# Patient Record
Sex: Male | Born: 1960 | Hispanic: No | Smoking: Former smoker
Health system: Southern US, Community
[De-identification: ages and names within clinical notes are randomized; demographics above are authoritative.]

## PROBLEM LIST (undated history)

## (undated) DIAGNOSIS — L309 Dermatitis, unspecified: Secondary | ICD-10-CM

## (undated) DIAGNOSIS — M25552 Pain in left hip: Secondary | ICD-10-CM

## (undated) DIAGNOSIS — G4730 Sleep apnea, unspecified: Secondary | ICD-10-CM

## (undated) DIAGNOSIS — G4733 Obstructive sleep apnea (adult) (pediatric): Secondary | ICD-10-CM

## (undated) DIAGNOSIS — Z9989 Dependence on other enabling machines and devices: Secondary | ICD-10-CM

## (undated) HISTORY — PX: NOSE SURGERY: SHX723

## (undated) HISTORY — DX: Dependence on other enabling machines and devices: Z99.89

## (undated) HISTORY — DX: Obstructive sleep apnea (adult) (pediatric): G47.33

## (undated) HISTORY — DX: Dermatitis, unspecified: L30.9

## (undated) HISTORY — DX: Pain in left hip: M25.552

---

## 2005-05-17 DIAGNOSIS — G4733 Obstructive sleep apnea (adult) (pediatric): Secondary | ICD-10-CM

## 2005-05-17 HISTORY — DX: Obstructive sleep apnea (adult) (pediatric): G47.33

## 2005-08-18 ENCOUNTER — Ambulatory Visit (HOSPITAL_BASED_OUTPATIENT_CLINIC_OR_DEPARTMENT_OTHER): Admission: RE | Admit: 2005-08-18 | Discharge: 2005-08-18 | Payer: Self-pay | Admitting: Otolaryngology

## 2005-08-22 ENCOUNTER — Ambulatory Visit: Payer: Self-pay | Admitting: Internal Medicine

## 2007-11-23 ENCOUNTER — Ambulatory Visit (HOSPITAL_COMMUNITY): Admission: RE | Admit: 2007-11-23 | Discharge: 2007-11-24 | Payer: Self-pay | Admitting: Otolaryngology

## 2010-09-29 NOTE — Op Note (Signed)
NAMEKEI, MCELHINEY NO.:  [id number]   MEDICAL RECORD NO.:  [id number]          PATIENT TYPE:  OIB   LOCATION:  3313                         FACILITY:  [facility]   PHYSICIAN:  Suzanna Obey, M.D.       DATE OF BIRTH:  June 06, 1960   DATE OF PROCEDURE:  11/23/2007  DATE OF DISCHARGE:                               OPERATIVE REPORT   PREOPERATIVE DIAGNOSES:  1. Deviated septum.  2. Turbinate hypertrophy.   POSTOPERATIVE DIAGNOSES:  1. Deviated septum.  2. Turbinate hypertrophy.   SURGICAL PROCEDURE:  Septoplasty and submucous resection of the inferior  turbinates.   ANESTHESIA:  General.   ESTIMATED BLOOD LOSS:  Less than 10 mL.   INDICATIONS:  This is a 50 year old with severe obstructive sleep apnea  that has been refracted to medical therapy and he requires a CPAP.  He  has nasal obstruction, so needs to have a good functioning nose.  He was  informed the risks and benefits of the procedure and options were  discussed.  All questions were answered and consent was obtained.   OPERATION:  The patient was taken to the operating room and placed in  supine position.  After general endotracheal tube anesthesia, he was  prepped and draped in the usual sterile manner.  Oxymetazoline pledgets  and 1% lidocaine with 1:100,000 epinephrine were placed in the nose, in  the septum, and inferior turbinates.  The right hemitransfixion incision  was performed raising mucoperichondrial and osteal flap.  The caudal was  divided about 2 cm posterior to the caudal strut and the deviated  portion of the cartilages were removed.  The Jansen-Middleton forceps  were used to remove the deviated bone and a 4-mm osteotome to remove the  spur on the left side.  This corrected the septal deflection.  The  turbinates were infractured.  A midline incision was made with a #15  blade.  Mucosal flap elevated superiorly.  Inferior mucosa and bone were  removed with the turbinate scissors and the  edge was cauterized with  suction cautery.  Both flaps were laid back down over the rough surface  and outfractured.  There was excellent hemostasis.  A hemitransfixion  incision was closed with interrupted 4-0 chromic and a 4-0 plain gut  quilting stitch placed at the septum.  Since there was great hemostasis,  no packing was placed.  Nasopharynx was suctioned of all debris.  The  patient was awakened and brought to the recovery in stable condition.  Counts were correct.           ______________________________  Suzanna Obey, M.D.    JB/MEDQ  D:  11/23/2007  T:  11/24/2007  Job:  [id number]

## 2010-10-02 NOTE — Procedures (Signed)
NAME:  Miguel Fisher, Miguel Fisher NO.:  [id number]   MEDICAL RECORD NO.:  [id number]          PATIENT TYPE:  OUT   LOCATION:  SLEEP CENTER                 FACILITY:  [facility]   PHYSICIAN:  Clinton D. Maple Hudson, M.D. DATE OF BIRTH:  [birth year]   DATE OF STUDY:                              NOCTURNAL POLYSOMNOGRAM   REFERRING PHYSICIAN:  Dr. Suzanna Obey.   INDICATION FOR STUDY:  Hypersomnia with sleep apnea.  Epworth sleepiness  score 13/24, BMI 33.  Weight 245 pounds.  No home medications listed.   SLEEP ARCHITECTURE:  Total sleep time 323 minutes with sleep efficiency 87%.  Stage I was 8%, stage II 72%, stages III and IV were absent.  REM 20% of  total sleep time.  Sleep latency 2 minutes, REM latency 104 minutes, awake  after sleep onset 47 minutes, arousal index increased at 51.7 indicating  increased sleep fragmentation.  No bedtime medication was taken.   RESPIRATORY DATA:  Split study protocol.  Apnea/hypopnea index (AHI, RDI)  89.4 obstructive events per hour indicating severe obstructive sleep  apnea/hypopnea syndrome before CPAP.  This included 176 obstructive apneas  and 14 hypopneas before CPAP.  Most sleep and most events initially were  while supine.  REM AHI of 15.9.  CPAP was titrated to 19 CWP, AHI 0 per  hour.  A large ResMed Ultra Mirage nasal/oral mask was used with heated  humidifier.   OXYGEN DATA:  Very loud snoring with oxygen desaturation to a nadir of 71%  before CPAP.  After CPAP control saturation held 95-98% on room air.   CARDIAC DATA:  Normal sinus rhythm.   MOVEMENT/PARASOMNIA:  Occasional leg jerk, insignificant.   IMPRESSION/RECOMMENDATIONS:  1.  Severe obstructive sleep apnea slash hypopnea syndrome, AHI 89.4 per      hour with events more common while supine.  Very loud snoring with      oxygen desaturation to a nadir of 71%.  2.  Successful CPAP titration to 19 CWP, AHI 0 per hour.  A large ResMed      Ultra Mirage nasal/oral mask was used  with heated humidifier.     Clinton D. Maple Hudson, M.D.  Diplomate, Biomedical engineer of Sleep Medicine  Electronically Signed    CDY/MEDQ  D:  08/22/2005 12:24:34  T:  08/23/2005 07:44:10  Job:  [id number]

## 2011-02-11 LAB — CBC
HCT: 47.2
Hemoglobin: 16.2
MCHC: 34.4
MCV: 84.8
RDW: 13.4

## 2011-02-11 LAB — BASIC METABOLIC PANEL
CO2: 26
Glucose, Bld: 110 — ABNORMAL HIGH
Potassium: 4.6
Sodium: 138

## 2011-02-11 LAB — HEPATIC FUNCTION PANEL
Bilirubin, Direct: 0.2
Total Bilirubin: 1.1

## 2012-05-17 DIAGNOSIS — M25552 Pain in left hip: Secondary | ICD-10-CM

## 2012-05-17 HISTORY — DX: Pain in left hip: M25.552

## 2014-01-19 ENCOUNTER — Emergency Department (HOSPITAL_COMMUNITY): Payer: BC Managed Care – PPO

## 2014-01-19 ENCOUNTER — Emergency Department (HOSPITAL_COMMUNITY)
Admission: EM | Admit: 2014-01-19 | Discharge: 2014-01-19 | Disposition: A | Discharge status: Home / Self Care | Payer: BC Managed Care – PPO | Attending: Emergency Medicine | Admitting: Emergency Medicine

## 2014-01-19 ENCOUNTER — Encounter (HOSPITAL_COMMUNITY): Payer: Self-pay | Admitting: Emergency Medicine

## 2014-01-19 DIAGNOSIS — Y92410 Unspecified street and highway as the place of occurrence of the external cause: Secondary | ICD-10-CM | POA: Diagnosis not present

## 2014-01-19 DIAGNOSIS — IMO0002 Reserved for concepts with insufficient information to code with codable children: Secondary | ICD-10-CM | POA: Insufficient documentation

## 2014-01-19 DIAGNOSIS — S99929A Unspecified injury of unspecified foot, initial encounter: Secondary | ICD-10-CM

## 2014-01-19 DIAGNOSIS — S6990XA Unspecified injury of unspecified wrist, hand and finger(s), initial encounter: Secondary | ICD-10-CM | POA: Diagnosis not present

## 2014-01-19 DIAGNOSIS — S46909A Unspecified injury of unspecified muscle, fascia and tendon at shoulder and upper arm level, unspecified arm, initial encounter: Secondary | ICD-10-CM | POA: Diagnosis not present

## 2014-01-19 DIAGNOSIS — S99919A Unspecified injury of unspecified ankle, initial encounter: Secondary | ICD-10-CM

## 2014-01-19 DIAGNOSIS — S8990XA Unspecified injury of unspecified lower leg, initial encounter: Secondary | ICD-10-CM | POA: Insufficient documentation

## 2014-01-19 DIAGNOSIS — Y9389 Activity, other specified: Secondary | ICD-10-CM | POA: Insufficient documentation

## 2014-01-19 DIAGNOSIS — S4980XA Other specified injuries of shoulder and upper arm, unspecified arm, initial encounter: Secondary | ICD-10-CM | POA: Insufficient documentation

## 2014-01-19 DIAGNOSIS — S298XXA Other specified injuries of thorax, initial encounter: Secondary | ICD-10-CM | POA: Diagnosis not present

## 2014-01-19 DIAGNOSIS — Z23 Encounter for immunization: Secondary | ICD-10-CM | POA: Diagnosis not present

## 2014-01-19 HISTORY — DX: Sleep apnea, unspecified: G47.30

## 2014-01-19 MED ORDER — OXYCODONE-ACETAMINOPHEN 5-325 MG PO TABS
1.0000 | ORAL_TABLET | Freq: Once | ORAL | Status: DC
  Filled 2014-01-19: qty 1

## 2014-01-19 MED ORDER — TETANUS-DIPHTH-ACELL PERTUSSIS 5-2.5-18.5 LF-MCG/0.5 IM SUSP
0.5000 mL | Freq: Once | INTRAMUSCULAR | Status: AC
  Administered 2014-01-19: 0.5 mL via INTRAMUSCULAR
  Filled 2014-01-19: qty 0.5

## 2014-01-19 MED ORDER — CYCLOBENZAPRINE HCL 10 MG PO TABS: 10.0000 mg | ORAL_TABLET | Freq: Two times a day (BID) | ORAL | Status: AC | PRN

## 2014-01-19 MED ORDER — HYDROCODONE-ACETAMINOPHEN 5-325 MG PO TABS: 1.0000 | ORAL_TABLET | Freq: Four times a day (QID) | ORAL | Status: AC | PRN

## 2014-01-19 MED ORDER — IBUPROFEN 800 MG PO TABS
800.0000 mg | ORAL_TABLET | Freq: Once | ORAL | Status: AC
  Administered 2014-01-19: 800 mg via ORAL
  Filled 2014-01-19: qty 1

## 2014-01-19 MED ORDER — IBUPROFEN 600 MG PO TABS: 600.0000 mg | ORAL_TABLET | Freq: Four times a day (QID) | ORAL | Status: AC | PRN

## 2014-01-19 NOTE — ED Provider Notes (Signed)
Medical screening examination/treatment/procedure(s) were performed by non-physician practitioner and as supervising physician I was immediately available for consultation/collaboration.   EKG Interpretation None       Ethelda Chick, MD 01/19/14 1753
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
## 2016-04-16 HISTORY — PX: NO PAST SURGERIES: SHX2092

## 2016-04-28 ENCOUNTER — Ambulatory Visit (INDEPENDENT_AMBULATORY_CARE_PROVIDER_SITE_OTHER): Payer: BC Managed Care – PPO | Admitting: Medical

## 2016-04-28 ENCOUNTER — Encounter: Payer: Self-pay | Admitting: Medical

## 2016-04-28 VITALS — BP 126/80 | HR 86 | Ht 72.0 in | Wt 247.0 lb

## 2016-04-28 DIAGNOSIS — Z8249 Family history of ischemic heart disease and other diseases of the circulatory system: Secondary | ICD-10-CM | POA: Diagnosis not present

## 2016-04-28 DIAGNOSIS — Z7189 Other specified counseling: Secondary | ICD-10-CM | POA: Insufficient documentation

## 2016-04-28 DIAGNOSIS — G4733 Obstructive sleep apnea (adult) (pediatric): Secondary | ICD-10-CM

## 2016-04-28 DIAGNOSIS — I8390 Asymptomatic varicose veins of unspecified lower extremity: Secondary | ICD-10-CM | POA: Diagnosis not present

## 2016-04-28 DIAGNOSIS — Z125 Encounter for screening for malignant neoplasm of prostate: Secondary | ICD-10-CM | POA: Diagnosis not present

## 2016-04-28 DIAGNOSIS — N529 Male erectile dysfunction, unspecified: Secondary | ICD-10-CM | POA: Diagnosis not present

## 2016-04-28 DIAGNOSIS — Z1211 Encounter for screening for malignant neoplasm of colon: Secondary | ICD-10-CM | POA: Diagnosis not present

## 2016-04-28 DIAGNOSIS — Z7185 Encounter for immunization safety counseling: Secondary | ICD-10-CM

## 2016-04-28 DIAGNOSIS — Z9989 Dependence on other enabling machines and devices: Secondary | ICD-10-CM | POA: Diagnosis not present

## 2016-04-28 DIAGNOSIS — Z0000 Encounter for general adult medical examination without abnormal findings: Secondary | ICD-10-CM

## 2016-04-28 LAB — CBC
HCT: 50.3 % — ABNORMAL HIGH (ref 38.5–50.0)
HEMOGLOBIN: 16.5 g/dL (ref 13.2–17.1)
MCH: 27.4 pg (ref 27.0–33.0)
MCHC: 32.8 g/dL (ref 32.0–36.0)
MCV: 83.6 fL (ref 80.0–100.0)
MPV: 10.9 fL (ref 7.5–12.5)
Platelets: 261 10*3/uL (ref 140–400)
RBC: 6.02 MIL/uL — AB (ref 4.20–5.80)
RDW: 13.8 % (ref 11.0–15.0)
WBC: 8.1 10*3/uL (ref 4.0–10.5)

## 2016-04-28 LAB — POCT URINALYSIS DIPSTICK
Bilirubin, UA: NEGATIVE
Glucose, UA: NEGATIVE
Leukocytes, UA: NEGATIVE
NITRITE UA: NEGATIVE
PH UA: 6
PROTEIN UA: NEGATIVE
UROBILINOGEN UA: NEGATIVE

## 2016-04-28 LAB — COMPREHENSIVE METABOLIC PANEL
ALBUMIN: 4.6 g/dL (ref 3.6–5.1)
ALK PHOS: 37 U/L — AB (ref 40–115)
ALT: 12 U/L (ref 9–46)
AST: 19 U/L (ref 10–35)
BUN: 14 mg/dL (ref 7–25)
CHLORIDE: 106 mmol/L (ref 98–110)
CO2: 25 mmol/L (ref 20–31)
CREATININE: 1.3 mg/dL (ref 0.70–1.33)
Calcium: 10.1 mg/dL (ref 8.6–10.3)
Glucose, Bld: 91 mg/dL (ref 65–99)
POTASSIUM: 4.4 mmol/L (ref 3.5–5.3)
Sodium: 143 mmol/L (ref 135–146)
TOTAL PROTEIN: 7.6 g/dL (ref 6.1–8.1)
Total Bilirubin: 0.9 mg/dL (ref 0.2–1.2)

## 2016-04-28 LAB — LIPID PANEL
CHOL/HDL RATIO: 4.2 ratio (ref <5.0)
CHOLESTEROL: 159 mg/dL (ref <200)
HDL: 38 mg/dL — ABNORMAL LOW (ref >40)
LDL Cholesterol: 106 mg/dL — ABNORMAL HIGH (ref <100)
TRIGLYCERIDES: 75 mg/dL (ref <150)
VLDL: 15 mg/dL (ref <30)

## 2016-04-28 LAB — PSA: PSA: 1.2 ng/mL (ref <=4.0)

## 2016-04-28 LAB — TSH: TSH: 1.87 mIU/L (ref 0.40–4.50)

## 2016-04-28 MED ORDER — AVANAFIL 100 MG PO TABS: 1.0000 | ORAL_TABLET | Freq: Every day | ORAL | 0 refills | Status: DC | PRN
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
## 2016-04-29 LAB — HEMOGLOBIN A1C
HEMOGLOBIN A1C: 5.8 % — AB (ref <5.7)
Mean Plasma Glucose: 120 mg/dL

## 2016-05-13 ENCOUNTER — Telehealth: Payer: Self-pay | Admitting: Medical

## 2016-05-13 ENCOUNTER — Other Ambulatory Visit: Payer: Self-pay | Admitting: Medical

## 2016-05-13 MED ORDER — AVANAFIL 100 MG PO TABS: 1.0000 | ORAL_TABLET | Freq: Every day | ORAL | 3 refills | Status: DC | PRN

## 2016-05-13 NOTE — Telephone Encounter (Signed)
Pt called and stated the samples he was given of [name] work well. He would like a [facility] into [facility]

## 2016-05-27 ENCOUNTER — Telehealth: Payer: Self-pay | Admitting: Family Medicine

## 2016-05-31 NOTE — Telephone Encounter (Signed)
dt ?

## 2016-06-28 ENCOUNTER — Other Ambulatory Visit: Payer: Self-pay | Admitting: Medical

## 2016-06-28 ENCOUNTER — Telehealth: Payer: Self-pay | Admitting: Medical

## 2016-06-28 MED ORDER — SILDENAFIL CITRATE 20 MG PO TABS: ORAL_TABLET | ORAL | 2 refills | Status: DC

## 2016-06-28 NOTE — Telephone Encounter (Signed)
Recv'd fax [name] is plan exclusion. 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  I called plan and their are no covered ED meds.  Pt is wanting to try the cash pay generic sildenafil.  Is it ok to switch to  Sildenafil #90 20mg  tablets for cash pay $95 thru [facility]?

## 2016-06-28 NOTE — Telephone Encounter (Signed)
Yes, call that in.   advised 2-3 tablets prn once daily.   It is different dosing than other name brand medications, so he would take 2 tablets together the first time he tries this.  The next time he could try 3 tablets simultaneously.  No more than 4 tablets on any given 24 hours period.

## 2016-07-06 MED ORDER — SILDENAFIL CITRATE 20 MG PO TABS: ORAL_TABLET | ORAL | 0 refills | Status: AC

## 2016-07-06 NOTE — Telephone Encounter (Signed)
This was called into [facility] t# [phone] price is $95 included shipping & they will call pt with details

## 2017-04-27 ENCOUNTER — Telehealth: Payer: Self-pay | Admitting: Medical

## 2017-04-27 NOTE — Telephone Encounter (Signed)
[place] brand Viagra #6 100mg  [facility] [address] [facility] [place] t# [phone] I scheduled him CPE 05/11/17

## 2017-04-28 NOTE — Telephone Encounter (Signed)
Ok, call it out

## 2017-04-28 NOTE — Telephone Encounter (Signed)
Called in.

## 2017-05-11 ENCOUNTER — Encounter: Payer: BC Managed Care – PPO | Admitting: Medical

## 2017-05-15 ENCOUNTER — Telehealth: Payer: Self-pay | Admitting: Medical

## 2017-05-15 NOTE — Telephone Encounter (Signed)
Recv'd P.A. VIAGRA In reviewing chart, pt's insurance does not cover any ED meds, as P.A. Has been done in past and pt is aware he has to pay out of pocket

## 2017-05-19 ENCOUNTER — Encounter: Payer: Self-pay | Admitting: Medical

## 2017-06-11 ENCOUNTER — Other Ambulatory Visit: Payer: Self-pay | Admitting: Medical

## 2017-06-13 NOTE — Telephone Encounter (Signed)
Is this okay to refill? 

## 2017-06-14 NOTE — Telephone Encounter (Signed)
Decline.   Last visit 2017.   Needs CPX appt

## 2017-08-11 ENCOUNTER — Other Ambulatory Visit: Payer: Self-pay | Admitting: Medical

## 2017-08-11 NOTE — Telephone Encounter (Signed)
DECLINE this.  He needs a physical appt.

## 2017-08-11 NOTE — Telephone Encounter (Signed)
Pt was notified. Pt would have to call back to schedule an cpe. I have called and denied med

## 2017-08-16 ENCOUNTER — Other Ambulatory Visit: Payer: Self-pay | Admitting: Medical

## 2017-08-16 NOTE — Telephone Encounter (Signed)
LOV-04/28/17  Please advise if okay to refill? Thanks!

## 2017-08-16 NOTE — Telephone Encounter (Signed)
Needs physical visit.  Decline medication.

## 2017-08-17 NOTE — Telephone Encounter (Signed)
See msg
